# Patient Record
Sex: Female | Born: 1990 | Race: White | Hispanic: No | Marital: Single | Smoking: Never smoker
Health system: Southern US, Community
[De-identification: ages and names within clinical notes are randomized; demographics above are authoritative.]

## PROBLEM LIST (undated history)

## (undated) DIAGNOSIS — R51 Headache: Secondary | ICD-10-CM

## (undated) DIAGNOSIS — R519 Headache, unspecified: Secondary | ICD-10-CM

## (undated) HISTORY — DX: Headache, unspecified: R51.9

## (undated) HISTORY — DX: Headache: R51

---
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
## 2014-11-10 ENCOUNTER — Encounter: Payer: Self-pay | Admitting: Neurology

## 2014-11-10 ENCOUNTER — Ambulatory Visit (INDEPENDENT_AMBULATORY_CARE_PROVIDER_SITE_OTHER): Payer: BLUE CROSS/BLUE SHIELD | Admitting: Neurology

## 2014-11-10 VITALS — BP 102/64 | HR 69 | Ht 66.0 in | Wt 152.8 lb

## 2014-11-10 DIAGNOSIS — G43719 Chronic migraine without aura, intractable, without status migrainosus: Secondary | ICD-10-CM | POA: Diagnosis not present

## 2014-11-10 DIAGNOSIS — R519 Headache, unspecified: Secondary | ICD-10-CM

## 2014-11-10 DIAGNOSIS — R51 Headache: Secondary | ICD-10-CM

## 2014-11-10 MED ORDER — SERTRALINE HCL 50 MG PO TABS: 50.0000 mg | ORAL_TABLET | Freq: Every day | ORAL | Status: DC

## 2014-11-10 NOTE — Patient Instructions (Signed)
Migraine Recommendations: 1.  Start sertraline  at bedtime.  Call in 4 weeks with update and we can adjust dose if needed. 2.  Take  Tylenol or try Naproxen  at earliest onset of headache.  3.  Limit use of pain relievers to no more than 2 days out of the week.  These medications include acetaminophen, ibuprofen, triptans and narcotics.  This will help reduce risk of rebound headaches. 4.  Be aware of common food triggers such as processed sweets, processed foods with nitrites (such as deli meat, hot dogs, sausages), foods with MSG, alcohol (such as wine), chocolate, certain cheeses, certain fruits (dried fruits, some citrus fruit), vinegar, diet soda. 4.  Avoid caffeine 5.  Routine exercise 6.  Proper sleep hygiene 7.  Stay adequately hydrated with water 8.  Keep a headache diary. 9.  Maintain proper stress management. 10.  Do not skip meals. 11.  Since you have had an increased frequency of headaches and they wake you up from sleep, we will get an MRI of brain without contrast 12.  Follow up in 3 months.  CALL IN 4 WEEKS WITH UPDATE

## 2014-11-10 NOTE — Progress Notes (Addendum)
NEUROLOGY CONSULTATION NOTE  Renee Evans MRN: [id number] DOB: Aug 28, 1990  Referring provider: Dr. Reola Calkins Primary care provider: None  Reason for consult:  Headaches  HISTORY OF PRESENT ILLNESS: Renee Evans is a 24 year old left-handed female who presents for headaches.  History obtained from patient and MD note.  Onset:  Since childhood, but significant increase in frequency over past 5 months Location:  Bi-temporal Quality:  Pressure but becomes throbbing when severe.  Headaches sometimes wakes her up from sleep. Intensity:  Usually 5/10 but 9/10 when severe Aura:  no Prodrome:  no Associated symptoms:  When severe, there is nausea and photophobia.  Once she saw "black spots" in her vision. Duration:  1 day with treatment, or up to 3 days without treatment Frequency:  15 headache days per month.   Triggers/exacerbating factors:  Exercise, stress Relieving factors:  nothing Activity:  Able to function  Past abortive medication:  Tylenol, Maxalt (made her feel smothered), BC, ibuprofen, Excedrin Past preventative medication:  none Other past therapy:  none  Current abortive medication:  Nothing.  She also received a prednisone taper which helped for a few days and has had 4 days without headache. Antihypertensive medications:  none Antidepressant medications:  none Anticonvulsant medications:  none Vitamins/Herbal/Supplements:  none Other therapy:  none Other medication:  NuvaRing  Caffeine:  1 cup of coffee daily Alcohol:  no Smoker:  no Diet:  Healthy.  Skips breakfast.  Drinks plenty of water Exercise:  routine Depression/stress:  Some  Sleep hygiene:  poor Family history of headache:  Dad had headaches  PAST MEDICAL HISTORY: Past Medical History  Diagnosis Date  . Headache     PAST SURGICAL HISTORY: No past surgical history on file.  MEDICATIONS: Current Outpatient Prescriptions on File Prior to Visit  Medication Sig Dispense Refill  .  etonogestrel-ethinyl estradiol (NUVARING) 0.12-0.015 MG/24HR vaginal ring Place 1 each vaginally every 28 (twenty-eight) days. Insert vaginally and leave in place for 3 consecutive weeks, then remove for 1 week.     No current facility-administered medications on file prior to visit.    ALLERGIES: No Known Allergies  FAMILY HISTORY: No family history on file.  SOCIAL HISTORY: Social History   Social History  . Marital Status: Single    Spouse Name: N/A  . Number of Children: N/A  . Years of Education: N/A   Occupational History  . Not on file.   Social History Main Topics  . Smoking status: Not on file  . Smokeless tobacco: Not on file  . Alcohol Use: Not on file  . Drug Use: Not on file  . Sexual Activity: Not on file   Other Topics Concern  . Not on file   Social History Narrative  . No narrative on file    REVIEW OF SYSTEMS: Constitutional: No fevers, chills, or sweats, no generalized fatigue, change in appetite Eyes: No visual changes, double vision, eye pain Ear, nose and throat: No hearing loss, ear pain, nasal congestion, sore throat Cardiovascular: No chest pain, palpitations Respiratory:  No shortness of breath at rest or with exertion, wheezes GastrointestinaI: No nausea, vomiting, diarrhea, abdominal pain, fecal incontinence Genitourinary:  No dysuria, urinary retention or frequency Musculoskeletal:  No neck pain, back pain Integumentary: No rash, pruritus, skin lesions Neurological: as above Psychiatric: No depression, insomnia, anxiety Endocrine: No palpitations, fatigue, diaphoresis, mood swings, change in appetite, change in weight, increased thirst Hematologic/Lymphatic:  No anemia, purpura, petechiae. Allergic/Immunologic: no itchy/runny eyes, nasal congestion, recent allergic  reactions, rashes  PHYSICAL EXAM: Filed Vitals:   11/10/14 0825  BP: 102/64  Pulse: 69   General: No acute distress.  Patient appears well-groomed.  Head:   Normocephalic/atraumatic Eyes:  fundi unremarkable, without vessel changes, exudates, hemorrhages or papilledema. Neck: supple, no paraspinal tenderness, full range of motion Back: No paraspinal tenderness Heart: regular rate and rhythm Lungs: Clear to auscultation bilaterally. Vascular: No carotid bruits. Neurological Exam: Mental status: alert and oriented to person, place, and time, recent and remote memory intact, fund of knowledge intact, attention and concentration intact, speech fluent and not dysarthric, language intact. Cranial nerves: CN I: not tested CN II: pupils equal, round and reactive to light, visual fields intact, fundi unremarkable, without vessel changes, exudates, hemorrhages or papilledema. CN III, IV, VI:  full range of motion, no nystagmus, no ptosis CN V: facial sensation intact CN VII: upper and lower face symmetric CN VIII: hearing intact CN IX, X: gag intact, uvula midline CN XI: sternocleidomastoid and trapezius muscles intact CN XII: tongue midline Bulk & Tone: normal, no fasciculations. Motor:  5/5 throughout Sensation: temperature and vibration sensation intact. Deep Tendon Reflexes:  2+ throughout, toes downgoing. Finger to nose testing:  Without dysmetria.  Heel to shin:  Without dysmetria.  Gait:  Normal station and stride.  Able to turn and tandem walk. Romberg negative.  IMPRESSION: Chronic migraine without aura, intractable  PLAN: Start sertraline  daily Naproxen  for abortive therapy Due to increased frequency of headaches, will get MRI of brain with and without contrast.  ADDENDUM:  Pre-authorization for MRI was denied, however since she has longstanding history of these headaches and her neurologic exam is normal, I don't feel it is medically necessary after all. She is to call in 4 weeks with update Follow up in 3 months.  45 minutes spent face to face with patient, over 50% spent discussing diagnosis and management.  Thank you  for allowing me to take part in the care of this patient.  Shon Millet, DO  CC:  Algis Greenhouse. Reola Calkins, MD

## 2014-11-11 ENCOUNTER — Telehealth: Payer: Self-pay

## 2014-11-11 NOTE — Telephone Encounter (Signed)
Called and LM for patient to return my call. Insurance will not approve her MRI and Dr. Everlena Deignan said that its okay because its not necessary unless symptoms worsen. [facility]

## 2014-11-11 NOTE — Telephone Encounter (Signed)
Spoke with pt. Pt. Advised and verbalized understanding. MAH 

## 2014-11-21 ENCOUNTER — Ambulatory Visit (HOSPITAL_COMMUNITY): Payer: BLUE CROSS/BLUE SHIELD

## 2014-12-08 ENCOUNTER — Telehealth: Payer: Self-pay | Admitting: Neurology

## 2014-12-08 NOTE — Telephone Encounter (Signed)
Pt called to update on med/zoloft/it is working well for her//[phone]

## 2014-12-08 NOTE — Telephone Encounter (Signed)
FYI

## 2014-12-13 ENCOUNTER — Other Ambulatory Visit: Payer: Self-pay | Admitting: *Deleted

## 2014-12-13 MED ORDER — SERTRALINE HCL 50 MG PO TABS: 50.0000 mg | ORAL_TABLET | Freq: Every day | ORAL | Status: DC

## 2014-12-13 NOTE — Telephone Encounter (Signed)
Rx sent 

## 2014-12-13 NOTE — Telephone Encounter (Signed)
Pt needs a refill on the zoloft called into the [facility] pt phone number [phone]

## 2015-01-24 ENCOUNTER — Encounter: Payer: Self-pay | Admitting: *Deleted

## 2015-01-24 ENCOUNTER — Ambulatory Visit (INDEPENDENT_AMBULATORY_CARE_PROVIDER_SITE_OTHER): Payer: BLUE CROSS/BLUE SHIELD | Admitting: Family Medicine

## 2015-01-24 ENCOUNTER — Encounter: Payer: Self-pay | Admitting: Family Medicine

## 2015-01-24 VITALS — BP 126/73 | HR 72 | Resp 18 | Ht 66.0 in | Wt 154.0 lb

## 2015-01-24 DIAGNOSIS — Z30430 Encounter for insertion of intrauterine contraceptive device: Secondary | ICD-10-CM

## 2015-01-24 DIAGNOSIS — Z01812 Encounter for preprocedural laboratory examination: Secondary | ICD-10-CM

## 2015-01-24 DIAGNOSIS — N882 Stricture and stenosis of cervix uteri: Secondary | ICD-10-CM | POA: Diagnosis not present

## 2015-01-24 LAB — POCT URINE PREGNANCY: PREG TEST UR: NEGATIVE

## 2015-01-24 MED ORDER — MISOPROSTOL 200 MCG PO TABS: 200.0000 ug | ORAL_TABLET | ORAL | Status: AC

## 2015-01-24 NOTE — Progress Notes (Signed)
    Subjective:    Patient ID: Renee Evans is a 24 y.o. female presenting with Birth Control Consult  on 01/24/2015  HPI: Here for birth control consult.  On NuvaRing but would like to change to Mirena IUD. No issues, but would like LARC for convenience.  Review of Systems  Constitutional: Negative for fever and chills.  Respiratory: Negative for shortness of breath.   Cardiovascular: Negative for chest pain.  Gastrointestinal: Negative for nausea, vomiting and abdominal pain.  Genitourinary: Negative for dysuria.  Skin: Negative for rash.      Objective:    BP 126/73 mmHg  Pulse 72  Resp 18  Ht 5\' 6"  (1.676 m)  Wt 154 lb (69.854 kg)  BMI 24.87 kg/m2  LMP 01/11/2015 Physical Exam  Constitutional: She is oriented to person, place, and time. She appears well-developed and well-nourished. No distress.  HENT:  Head: Normocephalic and atraumatic.  Eyes: No scleral icterus.  Neck: Neck supple.  Cardiovascular: Normal rate.   Pulmonary/Chest: Effort normal.  Abdominal: Soft.  Genitourinary:  BUS normal, vagina is pink and rugated, cervix is nulliparous without lesion, uterus is small and anteverted, no adnexal mass or tenderness.   Neurological: She is alert and oriented to person, place, and time.  Skin: Skin is warm and dry.  Psychiatric: She has a normal mood and affect.   Procedure: Patient identified, informed consent performed, signed copy in chart, time out was performed.  Urine pregnancy test negative.  Speculum placed in the vagina.  Cervix visualized.  Cleaned with Betadine x 2.  Grasped anteriourly with a single tooth tenaculum.  Attempted to sound uterus with Mirena device, but due to stenotic cervix unable. Attempted to use os finder and gentle dilation, but could not penetrate cervix.       Assessment & Plan:  Encounter for insertion of mirena IUD - Plan: POCT urine pregnancy  Cervical stenosis (uterine cervix) - Plan: misoprostol (CYTOTEC) 200 MCG  tablet  Unable to insert.  Return with menses and after misoprostol for re-attempt. Risks of insertion, side effect profile and menses profile reviewed.    Return in about 2 weeks (around 02/07/2015) for iud insertion again.  Deandra Gadson S 01/24/2015 2:18 PM

## 2015-01-24 NOTE — Patient Instructions (Signed)
Levonorgestrel intrauterine device (IUD) What is this medicine? LEVONORGESTREL IUD (LEE voe nor jes trel) is a contraceptive (birth control) device. The device is placed inside the uterus by a healthcare professional. It is used to prevent pregnancy and can also be used to treat heavy bleeding that occurs during your period. Depending on the device, it can be used for 3 to 5 years. This medicine may be used for other purposes; ask your health care provider or pharmacist if you have questions. What should I tell my health care provider before I take this medicine? They need to know if you have any of these conditions: -abnormal Pap smear -cancer of the breast, uterus, or cervix -diabetes -endometritis -genital or pelvic infection now or in the past -have more than one sexual partner or your partner has more than one partner -heart disease -history of an ectopic or tubal pregnancy -immune system problems -IUD in place -liver disease or tumor -problems with blood clots or take blood-thinners -use intravenous drugs -uterus of unusual shape -vaginal bleeding that has not been explained -an unusual or allergic reaction to levonorgestrel, other hormones, silicone, or polyethylene, medicines, foods, dyes, or preservatives -pregnant or trying to get pregnant -breast-feeding How should I use this medicine? This device is placed inside the uterus by a health care professional. Talk to your pediatrician regarding the use of this medicine in children. Special care may be needed. Overdosage: If you think you have taken too much of this medicine contact a poison control center or emergency room at once. NOTE: This medicine is only for you. Do not share this medicine with others. What if I miss a dose? This does not apply. What may interact with this medicine? Do not take this medicine with any of the following medications: -amprenavir -bosentan -fosamprenavir This medicine may also interact with  the following medications: -aprepitant -barbiturate medicines for inducing sleep or treating seizures -bexarotene -griseofulvin -medicines to treat seizures like carbamazepine, ethotoin, felbamate, oxcarbazepine, phenytoin, topiramate -modafinil -pioglitazone -rifabutin -rifampin -rifapentine -some medicines to treat HIV infection like atazanavir, indinavir, lopinavir, nelfinavir, tipranavir, ritonavir -St. John's wort -warfarin This list may not describe all possible interactions. Give your health care provider a list of all the medicines, herbs, non-prescription drugs, or dietary supplements you use. Also tell them if you smoke, drink alcohol, or use illegal drugs. Some items may interact with your medicine. What should I watch for while using this medicine? Visit your doctor or health care professional for regular check ups. See your doctor if you or your partner has sexual contact with others, becomes HIV positive, or gets a sexual transmitted disease. This product does not protect you against HIV infection (AIDS) or other sexually transmitted diseases. You can check the placement of the IUD yourself by reaching up to the top of your vagina with clean fingers to feel the threads. Do not pull on the threads. It is a good habit to check placement after each menstrual period. Call your doctor right away if you feel more of the IUD than just the threads or if you cannot feel the threads at all. The IUD may come out by itself. You may become pregnant if the device comes out. If you notice that the IUD has come out use a backup birth control method like condoms and call your health care provider. Using tampons will not change the position of the IUD and are okay to use during your period. What side effects may I notice from receiving this medicine?   Side effects that you should report to your doctor or health care professional as soon as possible: -allergic reactions like skin rash, itching or  hives, swelling of the face, lips, or tongue -fever, flu-like symptoms -genital sores -high blood pressure -no menstrual period for 6 weeks during use -pain, swelling, warmth in the leg -pelvic pain or tenderness -severe or sudden headache -signs of pregnancy -stomach cramping -sudden shortness of breath -trouble with balance, talking, or walking -unusual vaginal bleeding, discharge -yellowing of the eyes or skin Side effects that usually do not require medical attention (report to your doctor or health care professional if they continue or are bothersome): -acne -breast pain -change in sex drive or performance -changes in weight -cramping, dizziness, or faintness while the device is being inserted -headache -irregular menstrual bleeding within first 3 to 6 months of use -nausea This list may not describe all possible side effects. Call your doctor for medical advice about side effects. You may report side effects to FDA at 1-800-FDA-1088. Where should I keep my medicine? This does not apply. NOTE: This sheet is a summary. It may not cover all possible information. If you have questions about this medicine, talk to your doctor, pharmacist, or health care provider.    2016, [employer] Standard. (2011-03-14 13:54:04)  

## 2015-02-08 ENCOUNTER — Ambulatory Visit (INDEPENDENT_AMBULATORY_CARE_PROVIDER_SITE_OTHER): Payer: BLUE CROSS/BLUE SHIELD | Admitting: Family Medicine

## 2015-02-08 ENCOUNTER — Encounter: Payer: Self-pay | Admitting: Family Medicine

## 2015-02-08 VITALS — BP 115/74 | HR 65 | Ht 66.0 in | Wt 151.0 lb

## 2015-02-08 DIAGNOSIS — Z30018 Encounter for initial prescription of other contraceptives: Secondary | ICD-10-CM

## 2015-02-08 DIAGNOSIS — Z01812 Encounter for preprocedural laboratory examination: Secondary | ICD-10-CM

## 2015-02-08 DIAGNOSIS — Z30430 Encounter for insertion of intrauterine contraceptive device: Secondary | ICD-10-CM | POA: Diagnosis not present

## 2015-02-08 DIAGNOSIS — Z30017 Encounter for initial prescription of implantable subdermal contraceptive: Secondary | ICD-10-CM

## 2015-02-08 LAB — POCT URINE PREGNANCY: PREG TEST UR: NEGATIVE

## 2015-02-08 NOTE — Progress Notes (Signed)
    Subjective:    Patient ID: Renee Evans is a 24 y.o. female presenting with IUD Insertion  on 02/08/2015  HPI: Returns today for further attempts at IUD insertion.  She is on her cycle and has taken Cytotec.  We failed at insertion in the office 2 wks ago.  Review of Systems  Constitutional: Negative for fever and chills.  Respiratory: Negative for shortness of breath.   Cardiovascular: Negative for chest pain.  Gastrointestinal: Negative for nausea, vomiting and abdominal pain.  Genitourinary: Negative for dysuria.  Skin: Negative for rash.      Objective:    BP 115/74 mmHg  Pulse 65  Ht 5\' 6"  (1.676 m)  Wt 151 lb (68.493 kg)  BMI 24.38 kg/m2  LMP 02/07/2015 (Exact Date) Physical Exam  Constitutional: She is oriented to person, place, and time. She appears well-developed and well-nourished. No distress.  HENT:  Head: Normocephalic and atraumatic.  Eyes: No scleral icterus.  Neck: Neck supple.  Cardiovascular: Normal rate.   Pulmonary/Chest: Effort normal.  Abdominal: Soft.  Neurological: She is alert and oriented to person, place, and time.  Skin: Skin is warm and dry.  Psychiatric: She has a normal mood and affect.    Procedure: Patient identified, informed consent performed, signed copy in chart, time out was performed.  Urine pregnancy test negative.  Speculum placed in the vagina.  Cervix visualized.  Cleaned with Betadine x 2.  Grasped anteriourly with a single tooth tenaculum.  Attempted to enter the cervix with the os finder, but there was no give. The cervix allows the os finder to 4 cm, but no further.  Procedure: Patient given informed consent, signed copy in the chart, time out was performed. Pregnancy test was Neg. Appropriate time out taken.  Patient's left arm was prepped and draped in the usual sterile fashion.. The ruler used to measure and mark insertion area.  Pt was prepped with alcohol swab and then injected with 3 cc of 1% lidocaine with  epinephrine.  Pt was prepped with betadine, Nexplanon removed form packaging,  Device confirmed in needle, then inserted full length of needle and withdrawn per handbook instructions.  Pt insertion site covered with pressure dressing.   Minimal blood loss.  Pt tolerated the procedure well.        Assessment & Plan:   Problem List Items Addressed This Visit    None    Visit Diagnoses    Encounter for IUD insertion    -  Primary    Unable to complete    Relevant Orders    POCT urine pregnancy (Completed)    Encounter for initial prescription of other contraceptives        After counseling and after the failed IUD attempt, she will try Nexplanon instead.  This was inserted today.        Renee Evans S 02/08/2015 11:35 AM

## 2015-02-08 NOTE — Patient Instructions (Addendum)
Etonogestrel implant What is this medicine? ETONOGESTREL (et oh noe JES trel) is a contraceptive (birth control) device. It is used to prevent pregnancy. It can be used for up to 3 years. This medicine may be used for other purposes; ask your health care provider or pharmacist if you have questions. What should I tell my health care provider before I take this medicine? They need to know if you have any of these conditions: -abnormal vaginal bleeding -blood vessel disease or blood clots -cancer of the breast, cervix, or liver -depression -diabetes -gallbladder disease -headaches -heart disease or recent heart attack -high blood pressure -high cholesterol -kidney disease -liver disease -renal disease -seizures -tobacco smoker -an unusual or allergic reaction to etonogestrel, other hormones, anesthetics or antiseptics, medicines, foods, dyes, or preservatives -pregnant or trying to get pregnant -breast-feeding How should I use this medicine? This device is inserted just under the skin on the inner side of your upper arm by a health care professional. Talk to your pediatrician regarding the use of this medicine in children. Special care may be needed. Overdosage: If you think you have taken too much of this medicine contact a poison control center or emergency room at once. NOTE: This medicine is only for you. Do not share this medicine with others. What if I miss a dose? This does not apply. What may interact with this medicine? Do not take this medicine with any of the following medications: -amprenavir -bosentan -fosamprenavir This medicine may also interact with the following medications: -barbiturate medicines for inducing sleep or treating seizures -certain medicines for fungal infections like ketoconazole and itraconazole -griseofulvin -medicines to treat seizures like carbamazepine, felbamate, oxcarbazepine, phenytoin,  topiramate -modafinil -phenylbutazone -rifampin -some medicines to treat HIV infection like atazanavir, indinavir, lopinavir, nelfinavir, tipranavir, ritonavir -St. John's wort This list may not describe all possible interactions. Give your health care provider a list of all the medicines, herbs, non-prescription drugs, or dietary supplements you use. Also tell them if you smoke, drink alcohol, or use illegal drugs. Some items may interact with your medicine. What should I watch for while using this medicine? This product does not protect you against HIV infection (AIDS) or other sexually transmitted diseases. You should be able to feel the implant by pressing your fingertips over the skin where it was inserted. Contact your doctor if you cannot feel the implant, and use a non-hormonal birth control method (such as condoms) until your doctor confirms that the implant is in place. If you feel that the implant may have broken or become bent while in your arm, contact your healthcare provider. What side effects may I notice from receiving this medicine? Side effects that you should report to your doctor or health care professional as soon as possible: -allergic reactions like skin rash, itching or hives, swelling of the face, lips, or tongue -breast lumps -changes in emotions or moods -depressed mood -heavy or prolonged menstrual bleeding -pain, irritation, swelling, or bruising at the insertion site -scar at site of insertion -signs of infection at the insertion site such as fever, and skin redness, pain or discharge -signs of pregnancy -signs and symptoms of a blood clot such as breathing problems; changes in vision; chest pain; severe, sudden headache; pain, swelling, warmth in the leg; trouble speaking; sudden numbness or weakness of the face, arm or leg -signs and symptoms of liver injury like dark yellow or brown urine; general ill feeling or flu-like symptoms; light-colored stools; loss of  appetite; nausea; right upper belly   pain; unusually weak or tired; yellowing of the eyes or skin -unusual vaginal bleeding, discharge -signs and symptoms of a stroke like changes in vision; confusion; trouble speaking or understanding; severe headaches; sudden numbness or weakness of the face, arm or leg; trouble walking; dizziness; loss of balance or coordination Side effects that usually do not require medical attention (Report these to your doctor or health care professional if they continue or are bothersome.): -acne -back pain -breast pain -changes in weight -dizziness -general ill feeling or flu-like symptoms -headache -irregular menstrual bleeding -nausea -sore throat -vaginal irritation or inflammation This list may not describe all possible side effects. Call your doctor for medical advice about side effects. You may report side effects to FDA at 1-800-FDA-1088. Where should I keep my medicine? This drug is given in a hospital or clinic and will not be stored at home. NOTE: This sheet is a summary. It may not cover all possible information. If you have questions about this medicine, talk to your doctor, pharmacist, or health care provider.    2016, [employer]. (2013-11-26 14:07:06)  

## 2015-02-09 ENCOUNTER — Encounter: Payer: Self-pay | Admitting: *Deleted

## 2015-03-02 ENCOUNTER — Ambulatory Visit: Payer: BLUE CROSS/BLUE SHIELD | Admitting: Neurology

## 2016-05-20 ENCOUNTER — Other Ambulatory Visit: Payer: Self-pay | Admitting: Physician Assistant

## 2016-05-20 DIAGNOSIS — R102 Pelvic and perineal pain: Secondary | ICD-10-CM

## 2016-05-24 ENCOUNTER — Ambulatory Visit
Admission: RE | Admit: 2016-05-24 | Discharge: 2016-05-24 | Disposition: A | Discharge status: Home / Self Care | Payer: 59 | Source: Ambulatory Visit | Attending: Physician Assistant | Admitting: Physician Assistant

## 2016-05-24 DIAGNOSIS — R102 Pelvic and perineal pain: Secondary | ICD-10-CM

## 2016-05-27 ENCOUNTER — Other Ambulatory Visit: Payer: BLUE CROSS/BLUE SHIELD

## 2016-11-15 ENCOUNTER — Other Ambulatory Visit: Payer: Self-pay | Admitting: Family

## 2016-11-15 DIAGNOSIS — R102 Pelvic and perineal pain: Secondary | ICD-10-CM

## 2016-11-21 ENCOUNTER — Ambulatory Visit
Admission: RE | Admit: 2016-11-21 | Discharge: 2016-11-21 | Disposition: A | Discharge status: Home / Self Care | Payer: BLUE CROSS/BLUE SHIELD | Source: Ambulatory Visit | Attending: Family | Admitting: Family

## 2016-11-21 ENCOUNTER — Ambulatory Visit
Admission: RE | Admit: 2016-11-21 | Discharge: 2016-11-21 | Disposition: A | Discharge status: Home / Self Care | Payer: Self-pay | Source: Ambulatory Visit | Attending: Family | Admitting: Family

## 2016-11-21 DIAGNOSIS — R102 Pelvic and perineal pain: Secondary | ICD-10-CM

## 2019-02-28 ENCOUNTER — Encounter: Payer: Self-pay | Admitting: Emergency Medicine

## 2019-02-28 ENCOUNTER — Ambulatory Visit (INDEPENDENT_AMBULATORY_CARE_PROVIDER_SITE_OTHER): Payer: BC Managed Care – PPO

## 2019-02-28 ENCOUNTER — Ambulatory Visit
Admission: EM | Admit: 2019-02-28 | Discharge: 2019-02-28 | Disposition: A | Discharge status: Home / Self Care | Payer: BC Managed Care – PPO

## 2019-02-28 ENCOUNTER — Other Ambulatory Visit: Payer: Self-pay

## 2019-02-28 DIAGNOSIS — S99912A Unspecified injury of left ankle, initial encounter: Secondary | ICD-10-CM | POA: Diagnosis not present

## 2019-02-28 DIAGNOSIS — X501XXA Overexertion from prolonged static or awkward postures, initial encounter: Secondary | ICD-10-CM | POA: Diagnosis not present

## 2019-02-28 DIAGNOSIS — W228XXA Striking against or struck by other objects, initial encounter: Secondary | ICD-10-CM | POA: Diagnosis not present

## 2019-02-28 DIAGNOSIS — S8262XA Displaced fracture of lateral malleolus of left fibula, initial encounter for closed fracture: Secondary | ICD-10-CM | POA: Diagnosis not present

## 2019-02-28 DIAGNOSIS — M25572 Pain in left ankle and joints of left foot: Secondary | ICD-10-CM | POA: Diagnosis not present

## 2019-02-28 NOTE — ED Notes (Signed)
Patient able to ambulate independently  

## 2019-02-28 NOTE — Discharge Instructions (Addendum)
Recommend RICE: rest, ice, compression, elevation as needed for pain.   Cold therapy (ice packs) can be used to help swelling both after injury and after prolonged use of areas of chronic pain/aches.  For pain: recommend 350 mg-1000 mg of Tylenol (acetaminophen) and/or 200 mg - 800 mg of Advil (ibuprofen, Motrin) every 8 hours as needed.  May alternate between the two throughout the day as they are generally safe to take together.  DO NOT exceed more than 3000 mg of Tylenol or 3200 mg of ibuprofen in a 24 hour period as this could damage your stomach, kidneys, liver, or increase your bleeding risk. 

## 2019-02-28 NOTE — ED Triage Notes (Signed)
Pt presents to [facility] for assessment after stepping off the bottom step onto a vacuum hose which caused her to roll her left ankle.  States she heard a pop.

## 2019-02-28 NOTE — ED Provider Notes (Signed)
[facility]    CSN: [id number] Arrival date & time: 02/28/19  1002      History   Chief Complaint Chief Complaint  Patient presents with  . Ankle Pain    HPI Renee Evans is a 29 y.o. female presenting for left ankle pain since this morning after stepping on a vacuum hose which caused her to roll her ankle.  Endorsing pain with weightbearing, difficulty walking, lateral ankle pain with minimal swelling.  Has applied ice with some relief.  Largely concerned as she heard a popping sound when she rolled her ankle.    Past Medical History:  Diagnosis Date  . Headache     Patient Active Problem List   Diagnosis Date Noted  . Intractable chronic migraine without aura and without status migrainosus 11/10/2014    History reviewed. No pertinent surgical history.  OB History    Gravida  0   Para  0   Term  0   Preterm  0   AB  0   Living  0     SAB  0   TAB  0   Ectopic  0   Multiple  0   Live Births               Home Medications    Prior to Admission medications   Medication Sig Start Date End Date Taking? Authorizing Provider  hydrOXYzine (ATARAX/VISTARIL) 25 MG tablet Take 25 mg by mouth 3 (three) times daily as needed.   Yes [provider]  etonogestrel-ethinyl estradiol (NUVARING) 0.12-0.015 MG/24HR vaginal ring Place 1 each vaginally every 28 (twenty-eight) days. Reported on 02/08/2015    [provider]  misoprostol (CYTOTEC) 200 MCG tablet Take 1 tablet (200 mcg total) by mouth as directed. Take one the night before and one the morning of your appointment 01/24/15   Reva Bores, MD  sertraline (ZOLOFT) 100 MG tablet Take 100 mg by mouth daily.    [provider]    Family History Family History  Problem Relation Age of Onset  . Healthy Mother   . Healthy Father     Social History Social History   Tobacco Use  . Smoking status: Never Smoker  . Smokeless tobacco: Never Used  Substance Use  Topics  . Alcohol use: Yes    Alcohol/week: 0.0 standard drinks    Comment: Occasional   . Drug use: No     Allergies   Patient has no known allergies.   Review of Systems Review of Systems  Constitutional: Negative for fatigue and fever.  Respiratory: Negative for cough and shortness of breath.   Cardiovascular: Negative for chest pain and palpitations.  Musculoskeletal:       Positive for left ankle pain  Neurological: Negative for weakness and numbness.     Physical Exam Triage Vital Signs ED Triage Vitals  Enc Vitals Group     BP      Pulse      Resp      Temp      Temp src      SpO2      Weight      Height      Head Circumference      Peak Flow      Pain Score      Pain Loc      Pain Edu?      Excl. in GC?    No data found.  Updated Vital Signs BP  115/65 (BP Location: Left Arm)   Pulse 68   Temp 98.6 F (37 C) (Oral)   Resp 16   LMP 01/13/2019   SpO2 97%   Visual Acuity Right Eye Distance:   Left Eye Distance:   Bilateral Distance:    Right Eye Near:   Left Eye Near:    Bilateral Near:     Physical Exam Constitutional:      General: She is not in acute distress. HENT:     Head: Normocephalic and atraumatic.  Eyes:     General: No scleral icterus.    Pupils: Pupils are equal, round, and reactive to light.  Cardiovascular:     Rate and Rhythm: Normal rate.  Pulmonary:     Effort: Pulmonary effort is normal.  Musculoskeletal:     Comments: Mild edema over left lateral malleolus which is TTP.  No obvious deformity, discoloration.  Neurovascularly intact.  Patient has full active ROM of ankle, though does endorse pain with plantarflexion.  Gait/strength deferred  Skin:    Coloration: Skin is not jaundiced or pale.  Neurological:     Mental Status: She is alert and oriented to person, place, and time.      UC Treatments / Results  Labs (all labs ordered are listed, but only abnormal results are displayed) Labs Reviewed - No data to  display  EKG   Radiology DG Ankle Complete Left  Result Date: 02/28/2019 CLINICAL DATA:  Left ankle pain following inversion injury. EXAM: LEFT ANKLE COMPLETE - 3+ VIEW COMPARISON:  None. FINDINGS: There is a tiny bone fragment adjacent to the tip of the lateral malleolus which could represent a small avulsion injury. No other fracture identified. No evidence of dislocation. No significant arthropathy. Regional soft tissues are unremarkable. IMPRESSION: Tiny bone fragment adjacent to the tip of the lateral malleolus could represent a small avulsion fracture, if there is point tenderness in this location. Electronically Signed   By: Emmaline Kluver M.D.   On: 02/28/2019 11:16    Procedures Procedures (including critical care time)  Medications Ordered in UC Medications - No data to display  Initial Impression / Assessment and Plan / UC Course  I have reviewed the triage vital signs and the nursing notes.  Pertinent labs & imaging results that were available during my care of the patient were reviewed by me and considered in my medical decision making (see chart for details).     X-ray done office, reviewed by me radiology: Positive for small distal avulsion fracture of of lateral malleolus.  Patient given cam walker, crutches in office which he tolerated well.  Patient from [place] town, will follow up with PCP for referral per insurance for Ortho follow-up in 1 week.  Return precautions discussed, patient verbalized understanding and is agreeable to plan. Final Clinical Impressions(s) / UC Diagnoses   Final diagnoses:  Closed avulsion fracture of lateral malleolus of left fibula, initial encounter     Discharge Instructions     Recommend RICE: rest, ice, compression, elevation as needed for pain.   Cold therapy (ice packs) can be used to help swelling both after injury and after prolonged use of areas of chronic pain/aches.  For pain: recommend 350 mg-1000 mg of Tylenol  (acetaminophen) and/or 200 mg - 800 mg of Advil (ibuprofen, Motrin) every 8 hours as needed.  May alternate between the two throughout the day as they are generally safe to take together.  DO NOT exceed more than 3000 mg of Tylenol  or 3200 mg of ibuprofen in a 24 hour period as this could damage your stomach, kidneys, liver, or increase your bleeding risk.    ED Prescriptions    None     PDMP not reviewed this encounter.   [place], [place], [place] 02/28/19 1158

## 2019-03-01 ENCOUNTER — Telehealth: Payer: Self-pay

## 2021-01-06 IMAGING — DX DG ANKLE COMPLETE 3+V*L*
3 series · 3 of 3 positions shown · non-contrast
Comparison: None.

CLINICAL DATA: Left ankle pain following inversion injury.

EXAM:
LEFT ANKLE COMPLETE - 3+ VIEW

[ankle ap]
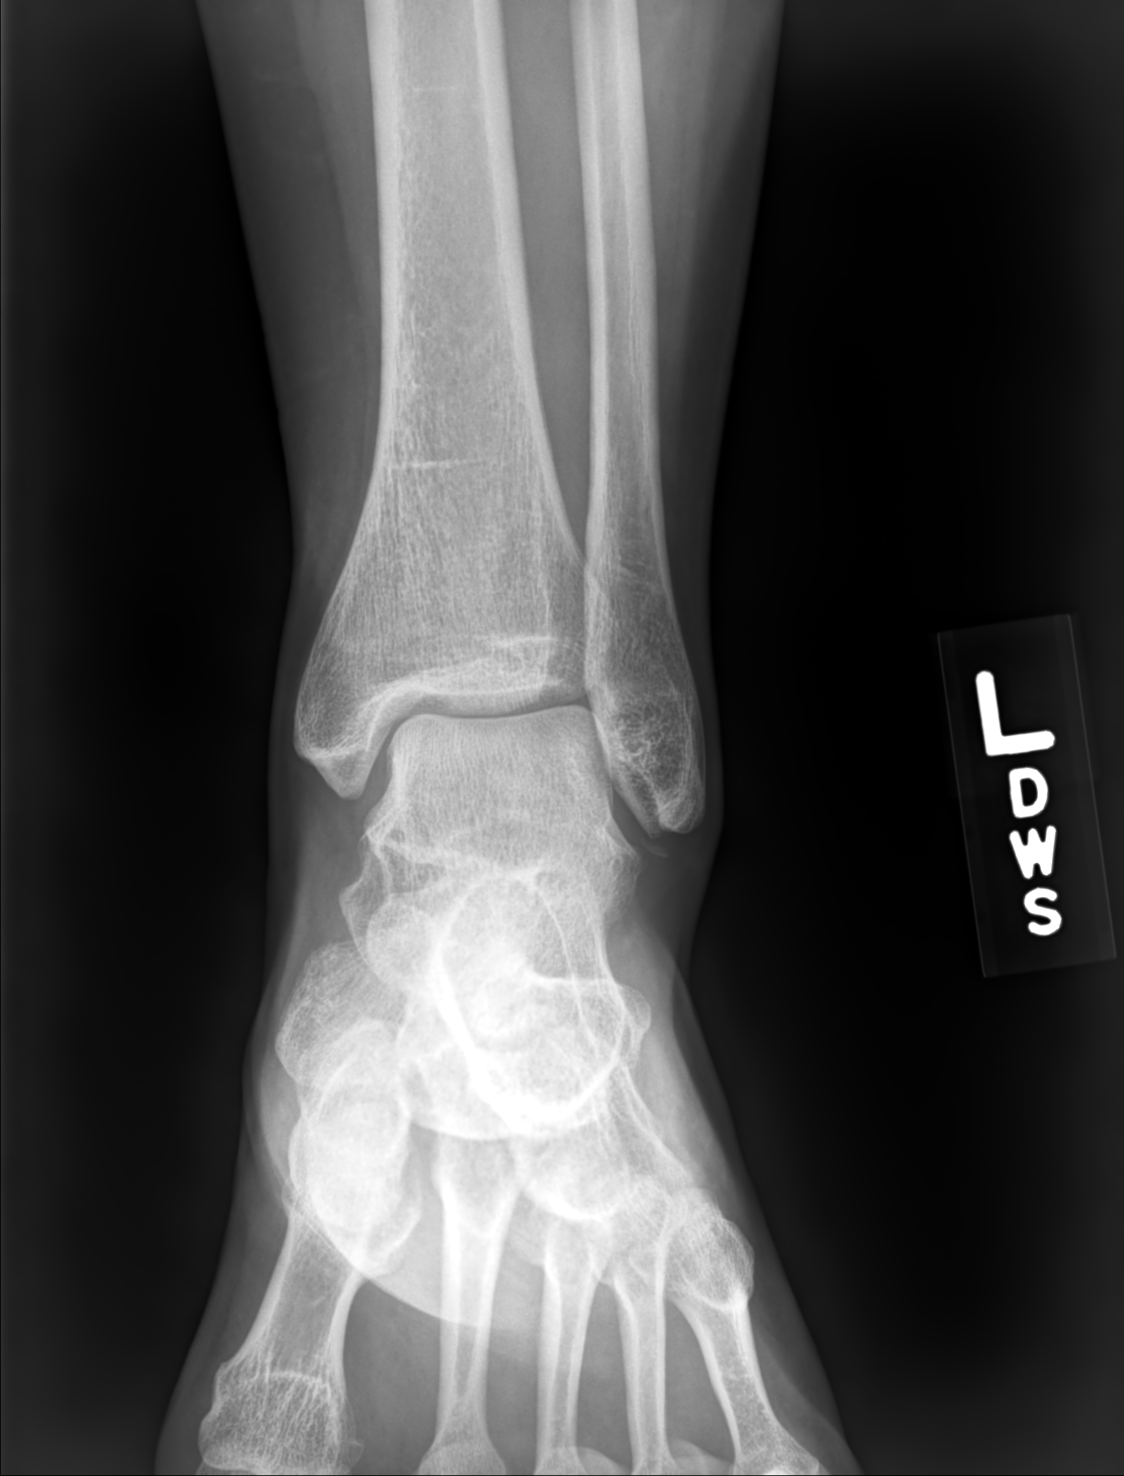

[ankle medial oblique]
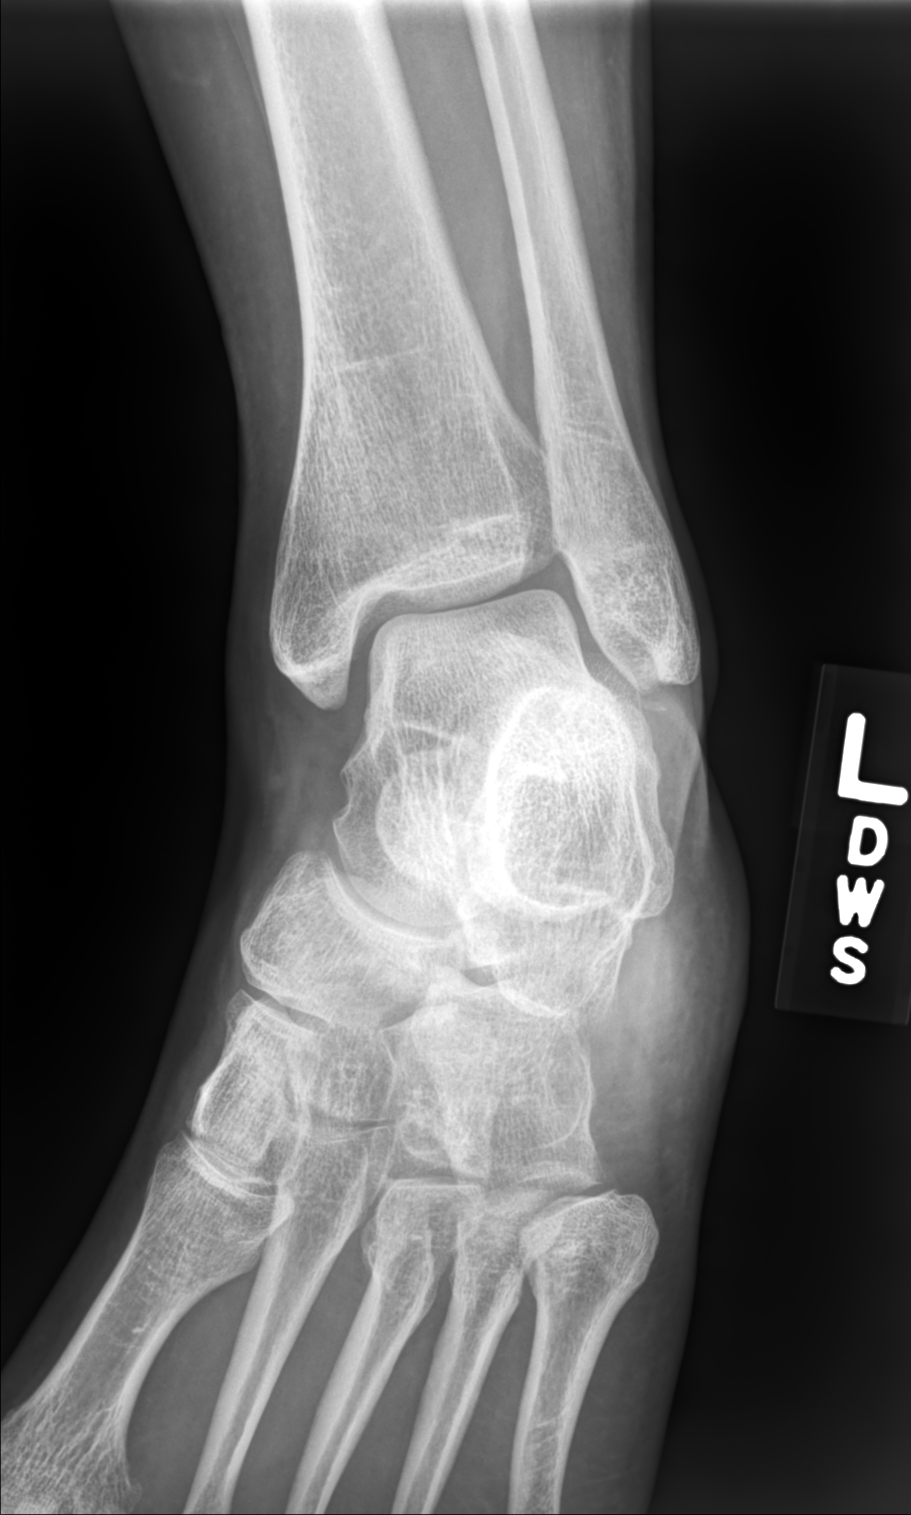

[ankle lat]
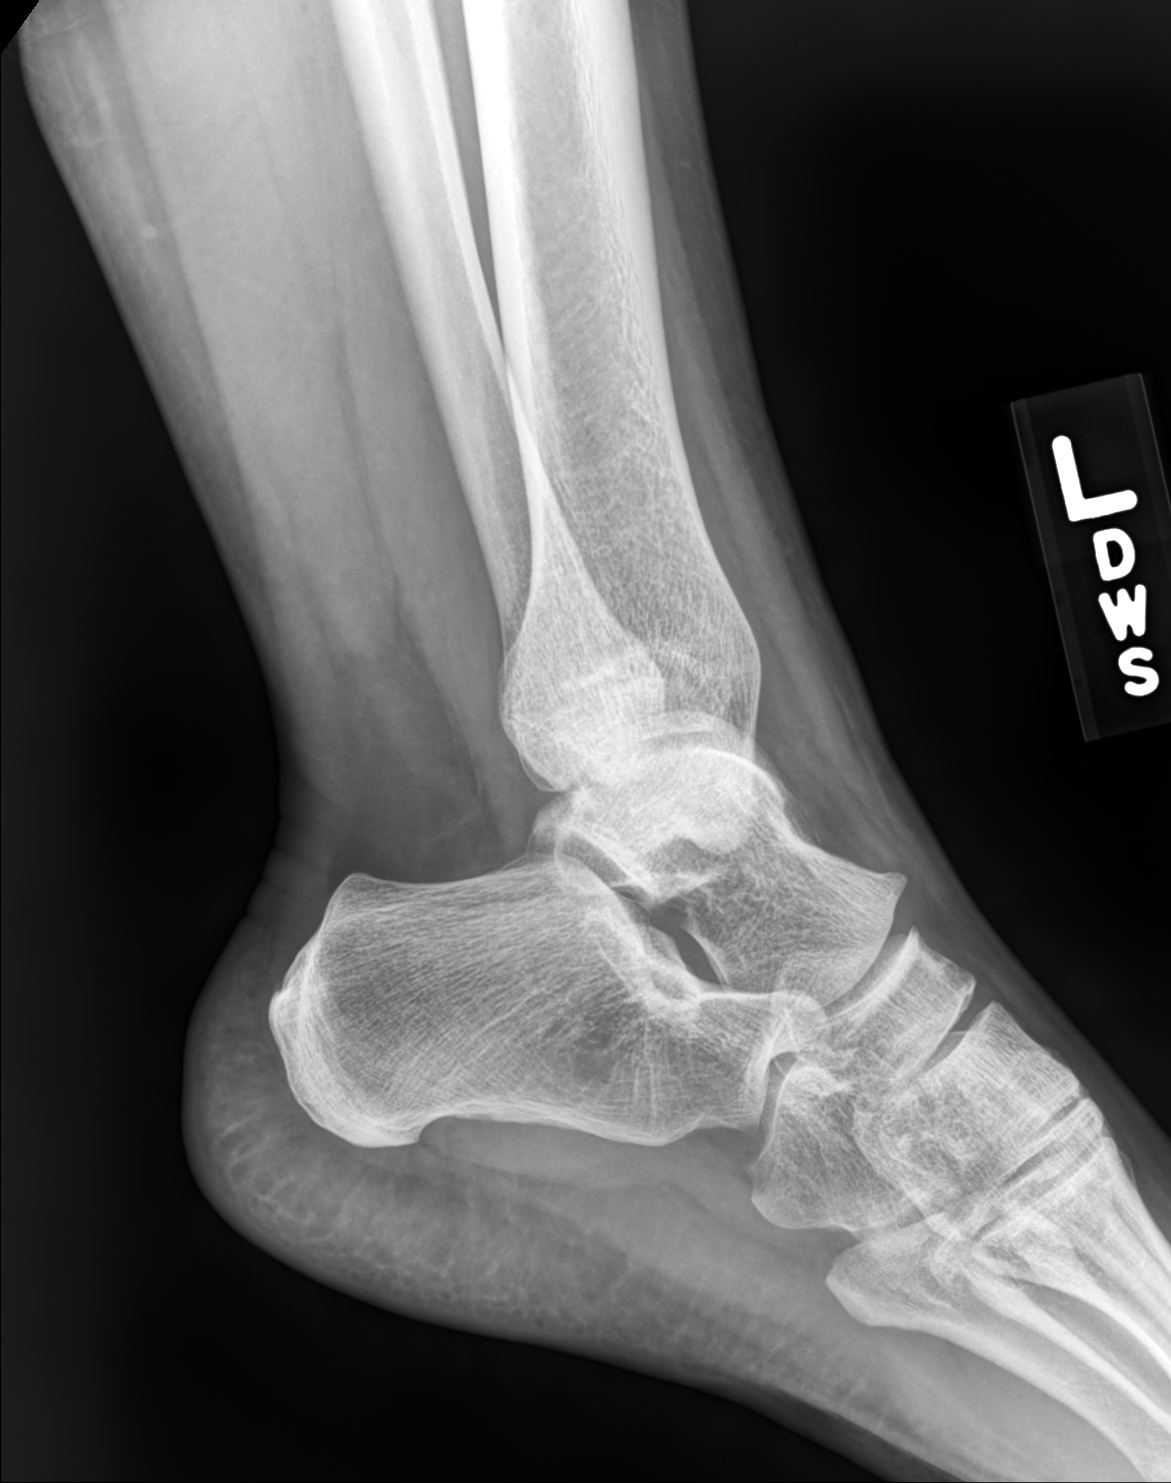

[3 of 3 positions shown; findings below may reference images not displayed]

FINDINGS: There is a tiny bone fragment adjacent to the tip of the lateral
malleolus which could represent a small avulsion injury. No other
fracture identified. No evidence of dislocation. No significant
arthropathy. Regional soft tissues are unremarkable.
IMPRESSION: Tiny bone fragment adjacent to the tip of the lateral malleolus
could represent a small avulsion fracture, if there is point
tenderness in this location.
